# Patient Record
Sex: Male | Born: 1975 | Race: White | Hispanic: No | Marital: Married | Smoking: Current every day smoker
Health system: Southern US, Community
[De-identification: ages and names within clinical notes are randomized; demographics above are authoritative.]

## PROBLEM LIST (undated history)

## (undated) DIAGNOSIS — I10 Essential (primary) hypertension: Secondary | ICD-10-CM

---

## 2003-10-05 ENCOUNTER — Emergency Department (HOSPITAL_COMMUNITY): Admission: AD | Admit: 2003-10-05 | Discharge: 2003-10-05 | Payer: Self-pay | Admitting: Family Medicine

## 2005-08-30 ENCOUNTER — Emergency Department: Payer: Self-pay | Admitting: Emergency Medicine

## 2006-02-23 ENCOUNTER — Emergency Department: Payer: Self-pay | Admitting: Emergency Medicine

## 2006-09-26 ENCOUNTER — Emergency Department: Payer: Self-pay | Admitting: Emergency Medicine

## 2006-09-27 ENCOUNTER — Ambulatory Visit: Payer: Self-pay | Admitting: Emergency Medicine

## 2007-09-10 ENCOUNTER — Emergency Department: Payer: Self-pay | Admitting: Emergency Medicine

## 2009-04-01 ENCOUNTER — Emergency Department: Payer: Self-pay | Admitting: Emergency Medicine

## 2010-03-19 ENCOUNTER — Emergency Department: Payer: Self-pay | Admitting: Emergency Medicine

## 2011-11-16 ENCOUNTER — Ambulatory Visit: Payer: Self-pay

## 2015-09-23 ENCOUNTER — Emergency Department
Admission: EM | Admit: 2015-09-23 | Discharge: 2015-09-23 | Disposition: A | Discharge status: Home / Self Care | Payer: Self-pay | Attending: Emergency Medicine | Admitting: Emergency Medicine

## 2015-09-23 ENCOUNTER — Encounter: Payer: Self-pay | Admitting: Emergency Medicine

## 2015-09-23 ENCOUNTER — Emergency Department: Payer: Self-pay

## 2015-09-23 DIAGNOSIS — S9032XA Contusion of left foot, initial encounter: Secondary | ICD-10-CM | POA: Insufficient documentation

## 2015-09-23 DIAGNOSIS — T07XXXA Unspecified multiple injuries, initial encounter: Secondary | ICD-10-CM

## 2015-09-23 DIAGNOSIS — Y9289 Other specified places as the place of occurrence of the external cause: Secondary | ICD-10-CM | POA: Insufficient documentation

## 2015-09-23 DIAGNOSIS — W208XXA Other cause of strike by thrown, projected or falling object, initial encounter: Secondary | ICD-10-CM | POA: Insufficient documentation

## 2015-09-23 DIAGNOSIS — Y990 Civilian activity done for income or pay: Secondary | ICD-10-CM | POA: Insufficient documentation

## 2015-09-23 DIAGNOSIS — Y9301 Activity, walking, marching and hiking: Secondary | ICD-10-CM | POA: Insufficient documentation

## 2015-09-23 DIAGNOSIS — S80812A Abrasion, left lower leg, initial encounter: Secondary | ICD-10-CM | POA: Insufficient documentation

## 2015-09-23 NOTE — ED Notes (Signed)
Patient ambulatory to triage with steady gait, without difficulty or distress noted; pt reports left ankle pain after desk fell on it at approx 4pm

## 2015-09-23 NOTE — ED Notes (Signed)
Pt presents with left ankle pain. Pt states he dropped a desk on his foot while at work today. Abrasions noted to left anterior calf. Swelling noted to top of left foot. Pedal pulses palpable. Pt rates pain as 8/10.

## 2015-09-23 NOTE — ED Provider Notes (Signed)
[facility] Emergency Department Provider Note ____________________________________________  Time seen: [id number]  I have reviewed the triage vital signs and the nursing notes.  HISTORY  Chief Complaint  Ankle Pain  HPI Gregory Beck is a 39 y.o. male reports to the ED for evaluation of injury sustained to his left foot today while working. He describes carrying a heavy desk with a coworker while walking backwards up in inclined yard. He reports that were ruts in the yard, and he stepped one causing him to fall. As he fell the desk landed on his left leg and foot. He was able to finish his job, and once he went home, he debated applying ice to the foot for comfort. He reports here for evaluation of pain to the lateral aspect of his left foot which is aggravated by walking and pushing off. He denies any head injury, loss of consciousness, or other injuries.He rates the pain 8/10 in triage and denies taking any medication at home for pain relief.  History reviewed. No pertinent past medical history.  There are no active problems to display for this patient.  History reviewed. No pertinent past surgical history.  No current outpatient prescriptions on file.  Allergies Review of patient's allergies indicates no known allergies.  No family history on file.  Social History Social History  Substance Use Topics  . Smoking status: Never Smoker   . Smokeless tobacco: None  . Alcohol Use: No   Review of Systems  Constitutional: Negative for fever. Eyes: Negative for visual changes. ENT: Negative for sore throat. Cardiovascular: Negative for chest pain. Respiratory: Negative for shortness of breath. Gastrointestinal: Negative for abdominal pain, vomiting and diarrhea. Genitourinary: Negative for dysuria. Musculoskeletal: Negative for back pain. Left foot pain as above. Skin: Negative for rash. Neurological: Negative for headaches, focal weakness or  numbness. ____________________________________________  PHYSICAL EXAM:  VITAL SIGNS: ED Triage Vitals  Enc Vitals Group     BP 09/23/15 1924 165/100 mmHg     Pulse Rate 09/23/15 1924 90     Resp 09/23/15 1924 20     Temp 09/23/15 1924 98 F (36.7 C)     Temp Source 09/23/15 1924 Oral     SpO2 09/23/15 1924 97 %     Weight 09/23/15 1924 220 lb (99.791 kg)     Height 09/23/15 1924 5\' 6"  (1.676 m)     Head Cir --      Peak Flow --      Pain Score 09/23/15 1923 8     Pain Loc --      Pain Edu? --      Excl. in GC? --    Constitutional: Alert and oriented. Well appearing and in no distress. Head: Normocephalic and atraumatic.      Eyes: Conjunctivae are normal. PERRL. Normal extraocular movements      Ears: Canals clear. TMs intact bilaterally.   Nose: No congestion/rhinorrhea.   Mouth/Throat: Mucous membranes are moist.   Neck: Supple. No thyromegaly. Hematological/Lymphatic/Immunological: No cervical lymphadenopathy. Cardiovascular: Normal rate, regular rhythm. Normal distal pulses. Respiratory: Normal respiratory effort. No wheezes/rales/rhonchi. Gastrointestinal: Soft and nontender. No distention. Musculoskeletal: Mildly antalgic gait favoring the left foot. Patient's left lower extremity shows 2-3 distinct superficial abrasions to the anterior lateral shin. The left foot is without obvious deformity, abrasion, ecchymosis, or swelling. Patient is tender to palpation over the lateral aspect of the foot. Ankle exam is normal without laxity. No calf or Achilles tenderness is appreciated. Nontender with normal  range of motion in all extremities.  Neurologic:  Normal gait without ataxia. Normal speech and language. No gross focal neurologic deficits are appreciated. Skin:  Skin is warm, dry and intact. No rash noted. Psychiatric: Mood and affect are normal. Patient exhibits appropriate insight and judgment. ____________________________________________   RADIOLOGY  Left  Foot IMPRESSION: No acute osseous findings.  I, Brileigh Sevcik, Charlesetta Ivory, personally viewed and evaluated these images (plain radiographs) as part of my medical decision making.  ____________________________________________  PROCEDURES  Ace wrap  ____________________________________________  INITIAL IMPRESSION / ASSESSMENT AND PLAN / ED COURSE  Patient with a left foot contusion and left leg abrasions. He is given instruction and management of superficial abrasions and contusion management using ice and anti-inflammatories. He is to rest with the foot elevated as needed and follow up with one of the local community clinics for ongoing symptoms. ____________________________________________  FINAL CLINICAL IMPRESSION(S) / ED DIAGNOSES  Final diagnoses:  Foot contusion, left, initial encounter  Multiple abrasions      Lissa Hoard, PA-C 09/23/15 2054  Loleta Rose, MD 09/23/15 2317

## 2015-09-23 NOTE — Discharge Instructions (Signed)
Foot Contusion A foot contusion is a deep bruise to the foot. Contusions are the result of an injury that caused bleeding under the skin. The contusion may turn blue, purple, or yellow. Minor injuries will give you a painless contusion, but more severe contusions may stay painful and swollen for a few weeks. CAUSES  A foot contusion comes from a direct blow to that area, such as a heavy object falling on the foot. SYMPTOMS   Swelling of the foot.  Discoloration of the foot.  Tenderness or soreness of the foot. DIAGNOSIS  You will have a physical exam and will be asked about your history. You may need an X-ray of your foot to look for a broken bone (fracture).  TREATMENT  An elastic wrap may be recommended to support your foot. Resting, elevating, and applying cold compresses to your foot are often the best treatments for a foot contusion. Over-the-counter medicines may also be recommended for pain control. HOME CARE INSTRUCTIONS   Put ice on the injured area.  Put ice in a plastic bag.  Place a towel between your skin and the bag.  Leave the ice on for 15-20 minutes, 03-04 times a day.  Only take over-the-counter or prescription medicines for pain, discomfort, or fever as directed by your caregiver.  If told, use an elastic wrap as directed. This can help reduce swelling. You may remove the wrap for sleeping, showering, and bathing. If your toes become numb, cold, or blue, take the wrap off and reapply it more loosely.  Elevate your foot with pillows to reduce swelling.  Try to avoid standing or walking while the foot is painful. Do not resume use until instructed by your caregiver. Then, begin use gradually. If pain develops, decrease use. Gradually increase activities that do not cause discomfort until you have normal use of your foot.  See your caregiver as directed. It is very important to keep all follow-up appointments in order to avoid any lasting problems with your foot,  including long-term (chronic) pain. SEEK IMMEDIATE MEDICAL CARE IF:   You have increased redness, swelling, or pain in your foot.  Your swelling or pain is not relieved with medicines.  You have loss of feeling in your foot or are unable to move your toes.  Your foot turns cold or blue.  You have pain when you move your toes.  Your foot becomes warm to the touch.  Your contusion does not improve in 2 days. MAKE SURE YOU:   Understand these instructions.  Will watch your condition.  Will get help right away if you are not doing well or get worse.   This information is not intended to replace advice given to you by your health care provider. Make sure you discuss any questions you have with your health care provider.   Document Released: 08/08/2006 Document Revised: 04/17/2012 Document Reviewed: 06/23/2015 Elsevier Interactive Patient Education 2016 [employer]  Keep the foot wrapped with the ace wrap for support as needed.  Take Ibuprofen or Aleve for pain relief. Follow-up with one of the local community clinics as needed for continued symptoms.

## 2015-11-18 ENCOUNTER — Encounter: Payer: Self-pay | Admitting: Emergency Medicine

## 2015-11-18 DIAGNOSIS — Z23 Encounter for immunization: Secondary | ICD-10-CM | POA: Insufficient documentation

## 2015-11-18 DIAGNOSIS — W228XXA Striking against or struck by other objects, initial encounter: Secondary | ICD-10-CM | POA: Insufficient documentation

## 2015-11-18 DIAGNOSIS — Y998 Other external cause status: Secondary | ICD-10-CM | POA: Insufficient documentation

## 2015-11-18 DIAGNOSIS — Y9289 Other specified places as the place of occurrence of the external cause: Secondary | ICD-10-CM | POA: Insufficient documentation

## 2015-11-18 DIAGNOSIS — S61011A Laceration without foreign body of right thumb without damage to nail, initial encounter: Secondary | ICD-10-CM | POA: Insufficient documentation

## 2015-11-18 DIAGNOSIS — Y9389 Activity, other specified: Secondary | ICD-10-CM | POA: Insufficient documentation

## 2015-11-18 NOTE — ED Notes (Signed)
Pt presents to ED with laceration to his right thumb. States he stuck his hand in a basket and is unsure what he cut his finger on. "U" shaped laceration present. Bleeding controlled. Unsure of last tetanus shot.

## 2015-11-19 ENCOUNTER — Emergency Department
Admission: EM | Admit: 2015-11-19 | Discharge: 2015-11-19 | Disposition: A | Discharge status: Home / Self Care | Payer: Self-pay | Attending: Emergency Medicine | Admitting: Emergency Medicine

## 2015-11-19 DIAGNOSIS — S61011A Laceration without foreign body of right thumb without damage to nail, initial encounter: Secondary | ICD-10-CM

## 2015-11-19 MED ORDER — CEPHALEXIN 500 MG PO CAPS
500.0000 mg | ORAL_CAPSULE | Freq: Once | ORAL | Status: AC
  Administered 2015-11-19: 500 mg via ORAL
  Filled 2015-11-19: qty 1

## 2015-11-19 MED ORDER — CEPHALEXIN 500 MG PO CAPS: 500.0000 mg | ORAL_CAPSULE | Freq: Two times a day (BID) | ORAL | Status: AC

## 2015-11-19 MED ORDER — LIDOCAINE HCL (PF) 1 % IJ SOLN
INTRAMUSCULAR | Status: AC
  Administered 2015-11-19: 5 mL via INTRADERMAL
  Filled 2015-11-19: qty 5

## 2015-11-19 MED ORDER — TETANUS-DIPHTH-ACELL PERTUSSIS 5-2.5-18.5 LF-MCG/0.5 IM SUSP
0.5000 mL | Freq: Once | INTRAMUSCULAR | Status: AC
  Administered 2015-11-19: 0.5 mL via INTRAMUSCULAR

## 2015-11-19 MED ORDER — OXYCODONE-ACETAMINOPHEN 5-325 MG PO TABS
1.0000 | ORAL_TABLET | Freq: Once | ORAL | Status: AC
  Administered 2015-11-19: 1 via ORAL
  Filled 2015-11-19: qty 1

## 2015-11-19 MED ORDER — TETANUS-DIPHTH-ACELL PERTUSSIS 5-2.5-18.5 LF-MCG/0.5 IM SUSP
INTRAMUSCULAR | Status: AC
  Administered 2015-11-19: 0.5 mL via INTRAMUSCULAR
  Filled 2015-11-19: qty 0.5

## 2015-11-19 MED ORDER — OXYCODONE-ACETAMINOPHEN 5-325 MG PO TABS: 1.0000 | ORAL_TABLET | ORAL | Status: AC | PRN

## 2015-11-19 MED ORDER — LIDOCAINE HCL (PF) 1 % IJ SOLN
5.0000 mL | Freq: Once | INTRAMUSCULAR | Status: AC
  Administered 2015-11-19: 5 mL via INTRADERMAL

## 2015-11-19 NOTE — ED Notes (Signed)
Pt stuck his hand in a basket and was cut by unknown object. Pt has a small laceration on his 1 st finger right hand. Pt denies any other complaints. Bleeding controlled.

## 2015-11-19 NOTE — Discharge Instructions (Signed)
Laceration Care, Adult PLEASE HAVE SUTURES REMOVED IN 7 DAYS A laceration is a cut that goes through all of the layers of the skin and into the tissue that is right under the skin. Some lacerations heal on their own. Others need to be closed with stitches (sutures), staples, skin adhesive strips, or skin glue. Proper laceration care minimizes the risk of infection and helps the laceration to heal better. HOW TO CARE FOR YOUR LACERATION If sutures or staples were used:  Keep the wound clean and dry.  If you were given a bandage (dressing), you should change it at least one time per day or as told by your health care provider. You should also change it if it becomes wet or dirty.  Keep the wound completely dry for the first 24 hours or as told by your health care provider. After that time, you may shower or bathe. However, make sure that the wound is not soaked in water until after the sutures or staples have been removed.  Clean the wound one time each day or as told by your health care provider:  Wash the wound with soap and water.  Rinse the wound with water to remove all soap.  Pat the wound dry with a clean towel. Do not rub the wound.  After cleaning the wound, apply a thin layer of antibiotic ointmentas told by your health care provider. This will help to prevent infection and keep the dressing from sticking to the wound.  Have the sutures or staples removed as told by your health care provider. If skin adhesive strips were used:  Keep the wound clean and dry.  If you were given a bandage (dressing), you should change it at least one time per day or as told by your health care provider. You should also change it if it becomes dirty or wet.  Do not get the skin adhesive strips wet. You may shower or bathe, but be careful to keep the wound dry.  If the wound gets wet, pat it dry with a clean towel. Do not rub the wound.  Skin adhesive strips fall off on their own. You may trim  the strips as the wound heals. Do not remove skin adhesive strips that are still stuck to the wound. They will fall off in time. If skin glue was used:  Try to keep the wound dry, but you may briefly wet it in the shower or bath. Do not soak the wound in water, such as by swimming.  After you have showered or bathed, gently pat the wound dry with a clean towel. Do not rub the wound.  Do not do any activities that will make you sweat heavily until the skin glue has fallen off on its own.  Do not apply liquid, cream, or ointment medicine to the wound while the skin glue is in place. Using those may loosen the film before the wound has healed.  If you were given a bandage (dressing), you should change it at least one time per day or as told by your health care provider. You should also change it if it becomes dirty or wet.  If a dressing is placed over the wound, be careful not to apply tape directly over the skin glue. Doing that may cause the glue to be pulled off before the wound has healed.  Do not pick at the glue. The skin glue usually remains in place for 5-10 days, then it falls off of the skin.  General Instructions  Take over-the-counter and prescription medicines only as told by your health care provider.  If you were prescribed an antibiotic medicine or ointment, take or apply it as told by your doctor. Do not stop using it even if your condition improves.  To help prevent scarring, make sure to cover your wound with sunscreen whenever you are outside after stitches are removed, after adhesive strips are removed, or when glue remains in place and the wound is healed. Make sure to wear a sunscreen of at least 30 SPF.  Do not scratch or pick at the wound.  Keep all follow-up visits as told by your health care provider. This is important.  Check your wound every day for signs of infection. Watch for:  Redness, swelling, or pain.  Fluid, blood, or pus.  Raise (elevate) the  injured area above the level of your heart while you are sitting or lying down, if possible. SEEK MEDICAL CARE IF:  You received a tetanus shot and you have swelling, severe pain, redness, or bleeding at the injection site.  You have a fever.  A wound that was closed breaks open.  You notice a bad smell coming from your wound or your dressing.  You notice something coming out of the wound, such as wood or glass.  Your pain is not controlled with medicine.  You have increased redness, swelling, or pain at the site of your wound.  You have fluid, blood, or pus coming from your wound.  You notice a change in the color of your skin near your wound.  You need to change the dressing frequently due to fluid, blood, or pus draining from the wound.  You develop a new rash.  You develop numbness around the wound. SEEK IMMEDIATE MEDICAL CARE IF:  You develop severe swelling around the wound.  Your pain suddenly increases and is severe.  You develop painful lumps near the wound or on skin that is anywhere on your body.  You have a red streak going away from your wound.  The wound is on your hand or foot and you cannot properly move a finger or toe.  The wound is on your hand or foot and you notice that your fingers or toes look pale or bluish.   This information is not intended to replace advice given to you by your health care provider. Make sure you discuss any questions you have with your health care provider.   Document Released: 10/17/2005 Document Revised: 03/03/2015 Document Reviewed: 10/13/2014 Elsevier Interactive Patient Education [employer]2016 Elsevier Inc.

## 2015-11-19 NOTE — ED Notes (Signed)

## 2015-11-19 NOTE — ED Provider Notes (Signed)
[facility] Emergency Department Provider Note  ____________________________________________  Time seen: 2:40am  I have reviewed the triage vital signs and the nursing notes.   HISTORY  Chief Complaint Extremity Laceration     HPI Gregory Beck is a 40 y.o. male tensor history of accidental right thumb laceration while sick in his hand into a basket. Patient unsure what cut his thumb. Patient states his last tetanus shot was possibly within the last 10 years however he is unsure.   Past medical history No pertinent past medical history  Past surgical history Pertinent past surgical history   Current Outpatient Rx  Name  Route  Sig  Dispense  Refill  . cephALEXin (KEFLEX) 500 MG capsule   Oral   Take 1 capsule (500 mg total) by mouth 2 (two) times daily.   14 capsule   0   . oxyCODONE-acetaminophen (PERCOCET/ROXICET) 5-325 MG tablet   Oral   Take 1 tablet by mouth every 4 (four) hours as needed for severe pain.   8 tablet   0     Allergies Review of patient's allergies indicates no known allergies.  No family history on file.  Social History Social History  Substance Use Topics  . Smoking status: Never Smoker   . Smokeless tobacco: Never Used  . Alcohol Use: Yes    Review of Systems  Constitutional: Negative for fever. Eyes: Negative for visual changes. ENT: Negative for sore throat. Cardiovascular: Negative for chest pain. Respiratory: Negative for shortness of breath. Gastrointestinal: Negative for abdominal pain, vomiting and diarrhea. Genitourinary: Negative for dysuria. Musculoskeletal: Negative for back pain. Skin: Negative for rash. Positive for right thumb laceration Neurological: Negative for headaches, focal weakness or numbness.   10-point ROS otherwise negative.  ____________________________________________   PHYSICAL EXAM:  VITAL SIGNS: ED Triage Vitals  Enc Vitals Group     BP 11/18/15 2333 140/88  mmHg     Pulse Rate 11/18/15 2333 88     Resp 11/18/15 2333 18     Temp 11/18/15 2333 97.5 F (36.4 C)     Temp Source 11/18/15 2333 Oral     SpO2 11/18/15 2333 97 %     Weight 11/18/15 2333 210 lb (95.255 kg)     Height 11/18/15 2333  (1.676 m)     Head Cir --      Peak Flow --      Pain Score 11/18/15 2333 6     Pain Loc --      Pain Edu? --      Excl. in GC? --      Constitutional: Alert and oriented. Well appearing and in no distress. Eyes: Conjunctivae are normal. PERRL. Normal extraocular movements. ENT   Head: Normocephalic and atraumatic.   Nose: No congestion/rhinnorhea.   Mouth/Throat: Mucous membranes are moist.   Neck: No stridor. Hematological/Lymphatic/Immunilogical: No cervical lymphadenopathy. Cardiovascular: Normal rate, regular rhythm. Normal and symmetric distal pulses are present in all extremities. No murmurs, rubs, or gallops. Respiratory: Normal respiratory effort without tachypnea nor retractions. Breath sounds are clear and equal bilaterally. No wheezes/rales/rhonchi. Gastrointestinal: Soft and nontender. No distention. There is no CVA tenderness. Genitourinary: deferred Musculoskeletal: Nontender with normal range of motion in all extremities. No joint effusions.  No lower extremity tenderness nor edema. Neurologic:  Normal speech and language. No gross focal neurologic deficits are appreciated. Speech is normal.  Skin:  Skin is warm, dry and intact. No rash noted. Positive for right thumb laceration Psychiatric: Mood  and affect are normal. Speech and behavior are normal. Patient exhibits appropriate insight and judgment.     PROCEDURES  Procedure(s) performed: LACERATION REPAIR Performed by: Darci Current Authorized by: Darci Current Consent: Verbal consent obtained. Risks and benefits: risks, benefits and alternatives were discussed Consent given by: patient Patient identity confirmed: provided demographic  data Prepped and Draped in normal sterile fashion Wound explored  Laceration Location: RIGHT THUMB   Laceration Length: 1 inch  No Foreign Bodies seen or palpated  Anesthesia: local infiltration  Local anesthetic: lidocaine 1%   Anesthetic total: 5 ml  Irrigation method: syringe Amount of cleaning: standard  Skin closure: 5-0 nylon   Number of sutures: 8   Technique: and plan interrupted   Patient tolerance: Patient tolerated the procedure well with no immediate complications.    INITIAL IMPRESSION / ASSESSMENT AND PLAN / ED COURSE  Pertinent labs & imaging results that were available during my care of the patient were reviewed by me and considered in my medical decision making (see chart for details).    ____________________________________________   FINAL CLINICAL IMPRESSION(S) / ED DIAGNOSES  Final diagnoses:  Thumb laceration, right, initial encounter      Darci Current, MD 11/19/15 [phone]

## 2016-05-14 ENCOUNTER — Emergency Department
Admission: EM | Admit: 2016-05-14 | Discharge: 2016-05-15 | Disposition: A | Discharge status: Home / Self Care | Payer: Self-pay | Attending: Emergency Medicine | Admitting: Emergency Medicine

## 2016-05-14 DIAGNOSIS — Y829 Unspecified medical devices associated with adverse incidents: Secondary | ICD-10-CM | POA: Insufficient documentation

## 2016-05-14 DIAGNOSIS — T783XXA Angioneurotic edema, initial encounter: Secondary | ICD-10-CM | POA: Insufficient documentation

## 2016-05-14 DIAGNOSIS — T464X5A Adverse effect of angiotensin-converting-enzyme inhibitors, initial encounter: Secondary | ICD-10-CM | POA: Insufficient documentation

## 2016-05-14 DIAGNOSIS — T887XXA Unspecified adverse effect of drug or medicament, initial encounter: Secondary | ICD-10-CM | POA: Insufficient documentation

## 2016-05-14 DIAGNOSIS — T7840XA Allergy, unspecified, initial encounter: Secondary | ICD-10-CM

## 2016-05-14 HISTORY — DX: Essential (primary) hypertension: I10

## 2016-05-14 MED ORDER — DIPHENHYDRAMINE HCL 50 MG/ML IJ SOLN
25.0000 mg | Freq: Once | INTRAMUSCULAR | Status: DC
  Filled 2016-05-14: qty 1

## 2016-05-14 MED ORDER — SODIUM CHLORIDE 0.9 % IV BOLUS (SEPSIS)
1000.0000 mL | Freq: Once | INTRAVENOUS | Status: AC
  Administered 2016-05-14: 1000 mL via INTRAVENOUS

## 2016-05-14 MED ORDER — EPINEPHRINE 0.3 MG/0.3ML IJ SOAJ
0.3000 mg | Freq: Once | INTRAMUSCULAR | Status: AC
  Administered 2016-05-14: 0.3 mg via INTRAMUSCULAR

## 2016-05-14 MED ORDER — FAMOTIDINE IN NACL 20-0.9 MG/50ML-% IV SOLN
20.0000 mg | Freq: Once | INTRAVENOUS | Status: DC
  Filled 2016-05-14: qty 50

## 2016-05-14 MED ORDER — DIPHENHYDRAMINE HCL 50 MG/ML IJ SOLN
25.0000 mg | Freq: Once | INTRAMUSCULAR | Status: AC
  Administered 2016-05-14: 25 mg via INTRAVENOUS

## 2016-05-14 MED ORDER — METHYLPREDNISOLONE SODIUM SUCC 125 MG IJ SOLR
125.0000 mg | Freq: Once | INTRAMUSCULAR | Status: DC
  Filled 2016-05-14: qty 2

## 2016-05-14 MED ORDER — METHYLPREDNISOLONE SODIUM SUCC 125 MG IJ SOLR
125.0000 mg | Freq: Once | INTRAMUSCULAR | Status: AC
  Administered 2016-05-14: 125 mg via INTRAVENOUS

## 2016-05-14 MED ORDER — FAMOTIDINE IN NACL 20-0.9 MG/50ML-% IV SOLN
20.0000 mg | Freq: Once | INTRAVENOUS | Status: AC
  Administered 2016-05-14: 20 mg via INTRAVENOUS

## 2016-05-14 MED ORDER — EPINEPHRINE 0.3 MG/0.3ML IJ SOAJ
0.3000 mg | Freq: Once | INTRAMUSCULAR | Status: DC
  Filled 2016-05-14: qty 0.3

## 2016-05-14 NOTE — ED Notes (Signed)
Pt. Presents to the ED with swollen bottom lip.  Pt. States starting on lisinopril 2 weeks ago.  Pt. States he noticed bottom lip swelling at around 4 pm today.

## 2016-05-14 NOTE — ED Provider Notes (Signed)
[facility] Emergency Department Provider Note   ____________________________________________  Time seen: Approximately 11:31 PM  I have reviewed the triage vital signs and the nursing notes.   HISTORY  Chief Complaint Allergic Reaction    HPI Gregory Beck is a 40 y.o. male who presents to the ED from home with a chief complaint of swollen lip. Reports being started on lisinopril 2 weeks ago. States he started feeling tingly in his bottom lip and mild swelling approximately 4 PM. By nighttime lower lip swelling had progressed to moderate swelling associated with feet itching and hives to trunk. Denies new foods, exposures, medicines. States he was seen for allergic reaction ("red spots") to his knee 2 weeks ago at an outside hospital; patient was placed on Keflex and started lisinopril for hypertension.He was not placed on steroids or Benadryl. Denies associated fever, chills, chest pain, shortness of breath, abdominal pain, nausea, vomiting, diarrhea. Denies recent travel or trauma. Nothing makes his symptoms better or worse.   Past Medical History  Diagnosis Date  . Hypertension     There are no active problems to display for this patient.   History reviewed. No pertinent past surgical history.  Current Outpatient Rx  Name  Route  Sig  Dispense  Refill  . oxyCODONE-acetaminophen (PERCOCET/ROXICET) 5-325 MG tablet   Oral   Take 1 tablet by mouth every 4 (four) hours as needed for severe pain.   8 tablet   0     Allergies Lisinopril  No family history on file.  Social History Social History  Substance Use Topics  . Smoking status: Never Smoker   . Smokeless tobacco: Never Used  . Alcohol Use: Yes    Review of Systems  Constitutional: No fever/chills. Eyes: No visual changes. ENT: Positive for lower lip swelling. No sore throat. Cardiovascular: Denies chest pain. Respiratory: Denies shortness of breath. Gastrointestinal: No  abdominal pain.  No nausea, no vomiting.  No diarrhea.  No constipation. Genitourinary: Negative for dysuria. Musculoskeletal: Negative for back pain. Skin: Positive for rash. Neurological: Negative for headaches, focal weakness or numbness.  10-point ROS otherwise negative.  ____________________________________________   PHYSICAL EXAM:  VITAL SIGNS: ED Triage Vitals  Enc Vitals Group     BP 05/14/16 2318 157/108 mmHg     Pulse Rate 05/14/16 2318 90     Resp 05/14/16 2318 20     Temp 05/14/16 2322 98.2 F (36.8 C)     Temp src --      SpO2 05/14/16 2318 99 %     Weight 05/14/16 2318 220 lb (99.791 kg)     Height 05/14/16 2318  (1.676 m)     Head Cir --      Peak Flow --      Pain Score 05/14/16 2319 0     Pain Loc --      Pain Edu? --      Excl. in GC? --     Constitutional: Alert and oriented. Well appearing and in no acute distress. Eyes: Conjunctivae are normal. PERRL. EOMI. Head: Atraumatic. Nose: No congestion/rhinnorhea. Mouth/Throat: Lower lip with mild to moderate swelling. There is no other facial swelling or tongue swelling. There is no hoarse or muffled voice. There is no drooling. Patient is tolerating secretions well. Mucous membranes are moist.  Oropharynx non-erythematous.   Neck: No stridor.  Soft submental space. Cardiovascular: Normal rate, regular rhythm. Grossly normal heart sounds.  Good peripheral circulation. Respiratory: Normal respiratory effort.  No retractions. Lungs CTAB. No wheezing. Gastrointestinal: Soft and nontender. No distention. No abdominal bruits. No CVA tenderness. Musculoskeletal: No lower extremity tenderness nor edema.  No joint effusions. Neurologic:  Normal speech and language. No gross focal neurologic deficits are appreciated. No gait instability. Skin:  Skin is warm, dry and intact. Mild urticaria noted to trunk. No petechiae. Psychiatric: Mood and affect are normal. Speech and behavior are  normal.  ____________________________________________   LABS (all labs ordered are listed, but only abnormal results are displayed)  Labs Reviewed - No data to display ____________________________________________  EKG  None ____________________________________________  RADIOLOGY  None ____________________________________________   PROCEDURES  Procedure(s) performed: None  Procedures  Critical Care performed: No  ____________________________________________   INITIAL IMPRESSION / ASSESSMENT AND PLAN / ED COURSE  Pertinent labs & imaging results that were available during my care of the patient were reviewed by me and considered in my medical decision making (see chart for details).  40 year old male who presents with lower lip swelling, itchy feet and truncal hives. Likely reaction secondary to lisinopril. Will initiate IV fluid resuscitation, intramuscular epinephrine, IV Solu-Medrol, Pepcid and Benadryl. Will monitor for a minimum of 3 hours.  ----------------------------------------- 2:45 AM on 05/15/2016 -----------------------------------------  Patient is feeling much better. There is no further swelling of his lower lip. There is no tongue swelling. Hives have resolved. Room air saturations 99%. Patient speaks in a clear voice. Discussed with patient that I am unsure if he requires antihypertensive. Encouraged patient to keep a blood pressure log. Will refer to allergist for testing. Will continue prednisone and Pepcid for the next 4 days as well as a prescription for EpiPen. Strict return precautions given. Patient and spouse verbalize understanding and agree with plan of care. ____________________________________________   FINAL CLINICAL IMPRESSION(S) / ED DIAGNOSES  Final diagnoses:  Allergic reaction caused by a drug  Angioedema, initial encounter      NEW MEDICATIONS STARTED DURING THIS VISIT:  New Prescriptions   No medications on file      Note:  This document was prepared using Dragon voice recognition software and may include unintentional dictation errors.    Irean HongJade J Atziri Zubiate, MD 05/15/16 [id number]

## 2016-05-14 NOTE — ED Notes (Signed)
Patient to ED with lip swelling and feet itching. Started taking Lisinopril two weeks ago. Today around 4pm started complaining to wife that his lips felt funny. All of a sudden tonight his lips were very swollen and he has hives and itchy feet. No tongue involvement at this time.

## 2016-05-15 MED ORDER — FAMOTIDINE 20 MG PO TABS: 20.0000 mg | ORAL_TABLET | Freq: Two times a day (BID) | ORAL | Status: AC

## 2016-05-15 MED ORDER — PREDNISONE 20 MG PO TABS: ORAL_TABLET | ORAL | Status: AC

## 2016-05-15 MED ORDER — EPINEPHRINE 0.3 MG/0.3ML IJ SOAJ: 0.3000 mg | Freq: Once | INTRAMUSCULAR | Status: AC

## 2016-05-15 NOTE — ED Notes (Signed)
Pt. States gradually improvement.  Pt. States easier to speak.

## 2016-05-15 NOTE — ED Notes (Signed)
Pt. Going home with wife. 

## 2016-05-15 NOTE — Discharge Instructions (Signed)
1. Discontinue lisinopril. We have updated our computer system to include this as an allergy. 2. Take these medicines for the next 4 days: Prednisone 60 mg daily Pepcid 20 mg twice daily 3. Use EpiPen in acute, life-threatening allergic reaction. 4. You may take Benadryl as needed. 5. Return to the ER for worsening symptoms, persistent vomiting, increased swelling, difficult breathing or other concerns.  Allergies An allergy is an abnormal reaction to a substance by the body's defense system (immune system). Allergies can develop at any age. WHAT CAUSES ALLERGIES? An allergic reaction happens when the immune system mistakenly reacts to a normally harmless substance, called an allergen, as if it were harmful. The immune system releases antibodies to fight the substance. Antibodies eventually release a chemical called histamine into the bloodstream. The release of histamine is meant to protect the body from infection, but it also causes discomfort. An allergic reaction can be triggered by:  Eating an allergen.  Inhaling an allergen.  Touching an allergen. WHAT TYPES OF ALLERGIES ARE THERE? There are many types of allergies. Common types include:  Seasonal allergies. People with this type of allergy are usually allergic to substances that are only present during certain seasons, such as molds and pollens.  Food allergies.  Drug allergies.  Insect allergies.  Animal dander allergies. WHAT ARE SYMPTOMS OF ALLERGIES? Possible allergy symptoms include:  Swelling of the lips, face, tongue, mouth, or throat.  Sneezing, coughing, or wheezing.  Nasal congestion.  Tingling in the mouth.  Rash.  Itching.  Itchy, red, swollen areas of skin (hives).  Watery eyes.  Vomiting.  Diarrhea.  Dizziness.  Lightheadedness.  Fainting.  Trouble breathing or swallowing.  Chest tightness.  Rapid heartbeat. HOW ARE ALLERGIES DIAGNOSED? Allergies are diagnosed with a medical and  family history and one or more of the following:  Skin tests.  Blood tests.  A food diary. A food diary is a record of all the foods and drinks you have in a day and of all the symptoms you experience.  The results of an elimination diet. An elimination diet involves eliminating foods from your diet and then adding them back in one by one to find out if a certain food causes an allergic reaction. HOW ARE ALLERGIES TREATED? There is no cure for allergies, but allergic reactions can be treated with medicine. Severe reactions usually need to be treated at a hospital. HOW CAN REACTIONS BE PREVENTED? The best way to prevent an allergic reaction is by avoiding the substance you are allergic to. Allergy shots and medicines can also help prevent reactions in some cases. People with severe allergic reactions may be able to prevent a life-threatening reaction called anaphylaxis with a medicine given right after exposure to the allergen.   This information is not intended to replace advice given to you by your health care provider. Make sure you discuss any questions you have with your health care provider.   Document Released: 01/10/2003 Document Revised: 11/07/2014 Document Reviewed: 07/29/2014 Elsevier Interactive Patient Education 2016 Elsevier Inc.  Angioedema Angioedema is a sudden swelling of tissues, often of the skin. It can occur on the face or genitals or in the abdomen or other body parts. The swelling usually develops over a short period and gets better in 24 to 48 hours. It often begins during the night and is found when the person wakes up. The person may also get red, itchy patches of skin (hives). Angioedema can be dangerous if it involves swelling of the air  passages.  Depending on the cause, episodes of angioedema may only happen once, come back in unpredictable patterns, or repeat for several years and then gradually fade away.  CAUSES  Angioedema can be caused by an allergic  reaction to various triggers. It can also result from nonallergic causes, including reactions to drugs, immune system disorders, viral infections, or an abnormal gene that is passed to you from your parents (hereditary). For some people with angioedema, the cause is unknown.  Some things that can trigger angioedema include:   Foods.   Medicines, such as ACE inhibitors, ARBs, nonsteroidal anti-inflammatory agents, or estrogen.   Latex.   Animal saliva.   Insect stings.   Dyes used in X-rays.   Mild injury.   Dental work.  Surgery.  Stress.   Sudden changes in temperature.   Exercise. SIGNS AND SYMPTOMS   Swelling of the skin.  Hives. If these are present, there is also intense itching.  Redness in the affected area.   Pain in the affected area.  Swollen lips or tongue.  Breathing problems. This may happen if the air passages swell.  Wheezing. If internal organs are involved, there may be:   Nausea.   Abdominal pain.   Vomiting.   Difficulty swallowing.   Difficulty passing urine. DIAGNOSIS   Your health care provider will examine the affected area and take a medical and family history.  Various tests may be done to help determine the cause. Tests may include:  Allergy skin tests to see if the problem is an allergic reaction.   Blood tests to check for hereditary angioedema.   Tests to check for underlying diseases that could cause the condition.   A review of your medicines, including over-the-counter medicines, may be done. TREATMENT  Treatment will depend on the cause of the angioedema. Possible treatments include:   Removal of anything that triggered the condition (such as stopping certain medicines).   Medicines to treat symptoms or prevent attacks. Medicines given may include:   Antihistamines.   Epinephrine injection.   Steroids.   Hospitalization may be required for severe attacks. If the air passages are  affected, it can be an emergency. Tubes may need to be placed to keep the airway open. HOME CARE INSTRUCTIONS   Take all medicines as directed by your health care provider.  If you were given medicines for emergency allergy treatment, always carry them with you.  Wear a medical bracelet as directed by your health care provider.   Avoid known triggers. SEEK MEDICAL CARE IF:   You have repeat attacks of angioedema.   Your attacks are more frequent or more severe despite preventive measures.   You have hereditary angioedema and are considering having children. It is important to discuss with your health care provider the risks of passing the condition on to your children. SEEK IMMEDIATE MEDICAL CARE IF:   You have severe swelling of the mouth, tongue, or lips.  You have difficulty breathing.   You have difficulty swallowing.   You faint. MAKE SURE YOU:  Understand these instructions.  Will watch your condition.  Will get help right away if you are not doing well or get worse.   This information is not intended to replace advice given to you by your health care provider. Make sure you discuss any questions you have with your health care provider.   Document Released: 12/26/2001 Document Revised: 11/07/2014 Document Reviewed: 06/10/2013 Elsevier Interactive Patient Education 2016 [employer].  Hives Hives are  itchy, red, puffy (swollen) areas of the skin. Hives can change in size and location on your body. Hives can come and go for hours, days, or weeks. Hives do not spread from person to person (noncontagious). Scratching, exercise, and stress can make your hives worse. HOME CARE  Avoid things that cause your hives (triggers).  Take antihistamine medicines as told by your doctor. Do not drive while taking an antihistamine.  Take any other medicines for itching as told by your doctor.  Wear loose-fitting clothing.  Keep all doctor visits as told. GET HELP RIGHT  AWAY IF:   You have a fever.  Your tongue or lips are puffy.  You have trouble breathing or swallowing.  You feel tightness in the throat or chest.  You have belly (abdominal) pain.  You have lasting or severe itching that is not helped by medicine.  You have painful or puffy joints. These problems may be the first sign of a life-threatening allergic reaction. Call your local emergency services (911 in [place] MAKE SURE YOU:   Understand these instructions.  Will watch your condition.  Will get help right away if you are not doing well or get worse.   This information is not intended to replace advice given to you by your health care provider. Make sure you discuss any questions you have with your health care provider.   Document Released: 07/26/2008 Document Revised: 04/17/2012 Document Reviewed: 01/10/2012 Elsevier Interactive Patient Education [employer] Elsevier Inc.

## 2017-04-22 ENCOUNTER — Emergency Department
Admission: EM | Admit: 2017-04-22 | Discharge: 2017-04-22 | Disposition: A | Discharge status: Home / Self Care | Payer: No Typology Code available for payment source | Attending: Emergency Medicine | Admitting: Emergency Medicine

## 2017-04-22 ENCOUNTER — Emergency Department: Payer: No Typology Code available for payment source

## 2017-04-22 ENCOUNTER — Encounter: Payer: Self-pay | Admitting: Emergency Medicine

## 2017-04-22 DIAGNOSIS — S92332A Displaced fracture of third metatarsal bone, left foot, initial encounter for closed fracture: Secondary | ICD-10-CM | POA: Diagnosis not present

## 2017-04-22 DIAGNOSIS — S92352A Displaced fracture of fifth metatarsal bone, left foot, initial encounter for closed fracture: Secondary | ICD-10-CM | POA: Insufficient documentation

## 2017-04-22 DIAGNOSIS — Y929 Unspecified place or not applicable: Secondary | ICD-10-CM | POA: Insufficient documentation

## 2017-04-22 DIAGNOSIS — S92322A Displaced fracture of second metatarsal bone, left foot, initial encounter for closed fracture: Secondary | ICD-10-CM | POA: Diagnosis not present

## 2017-04-22 DIAGNOSIS — F17210 Nicotine dependence, cigarettes, uncomplicated: Secondary | ICD-10-CM | POA: Diagnosis not present

## 2017-04-22 DIAGNOSIS — S92355A Nondisplaced fracture of fifth metatarsal bone, left foot, initial encounter for closed fracture: Secondary | ICD-10-CM

## 2017-04-22 DIAGNOSIS — Y999 Unspecified external cause status: Secondary | ICD-10-CM | POA: Insufficient documentation

## 2017-04-22 DIAGNOSIS — S92342A Displaced fracture of fourth metatarsal bone, left foot, initial encounter for closed fracture: Secondary | ICD-10-CM | POA: Diagnosis not present

## 2017-04-22 DIAGNOSIS — Z79899 Other long term (current) drug therapy: Secondary | ICD-10-CM | POA: Diagnosis not present

## 2017-04-22 DIAGNOSIS — I10 Essential (primary) hypertension: Secondary | ICD-10-CM | POA: Diagnosis not present

## 2017-04-22 DIAGNOSIS — Y9389 Activity, other specified: Secondary | ICD-10-CM | POA: Insufficient documentation

## 2017-04-22 DIAGNOSIS — S99922A Unspecified injury of left foot, initial encounter: Secondary | ICD-10-CM | POA: Diagnosis present

## 2017-04-22 MED ORDER — MELOXICAM 15 MG PO TABS: 15.0000 mg | ORAL_TABLET | Freq: Every day | ORAL | 0 refills | Status: AC

## 2017-04-22 MED ORDER — HYDROCODONE-ACETAMINOPHEN 5-325 MG PO TABS: 1.0000 | ORAL_TABLET | ORAL | 0 refills | Status: AC | PRN

## 2017-04-22 MED ORDER — HYDROCODONE-ACETAMINOPHEN 5-325 MG PO TABS
1.0000 | ORAL_TABLET | Freq: Once | ORAL | Status: AC
  Administered 2017-04-22: 1 via ORAL
  Filled 2017-04-22: qty 1

## 2017-04-22 NOTE — ED Notes (Signed)
Patient is complaining of numbness to his 4th and 5th toes on his left foot.
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
## 2019-06-11 IMAGING — DX DG FOOT COMPLETE 3+V*L*
3 series · 3 of 3 positions shown · non-contrast
Comparison: None.

CLINICAL DATA: Pain after trauma.

EXAM:
LEFT FOOT - COMPLETE 3+ VIEW

[foot ap]
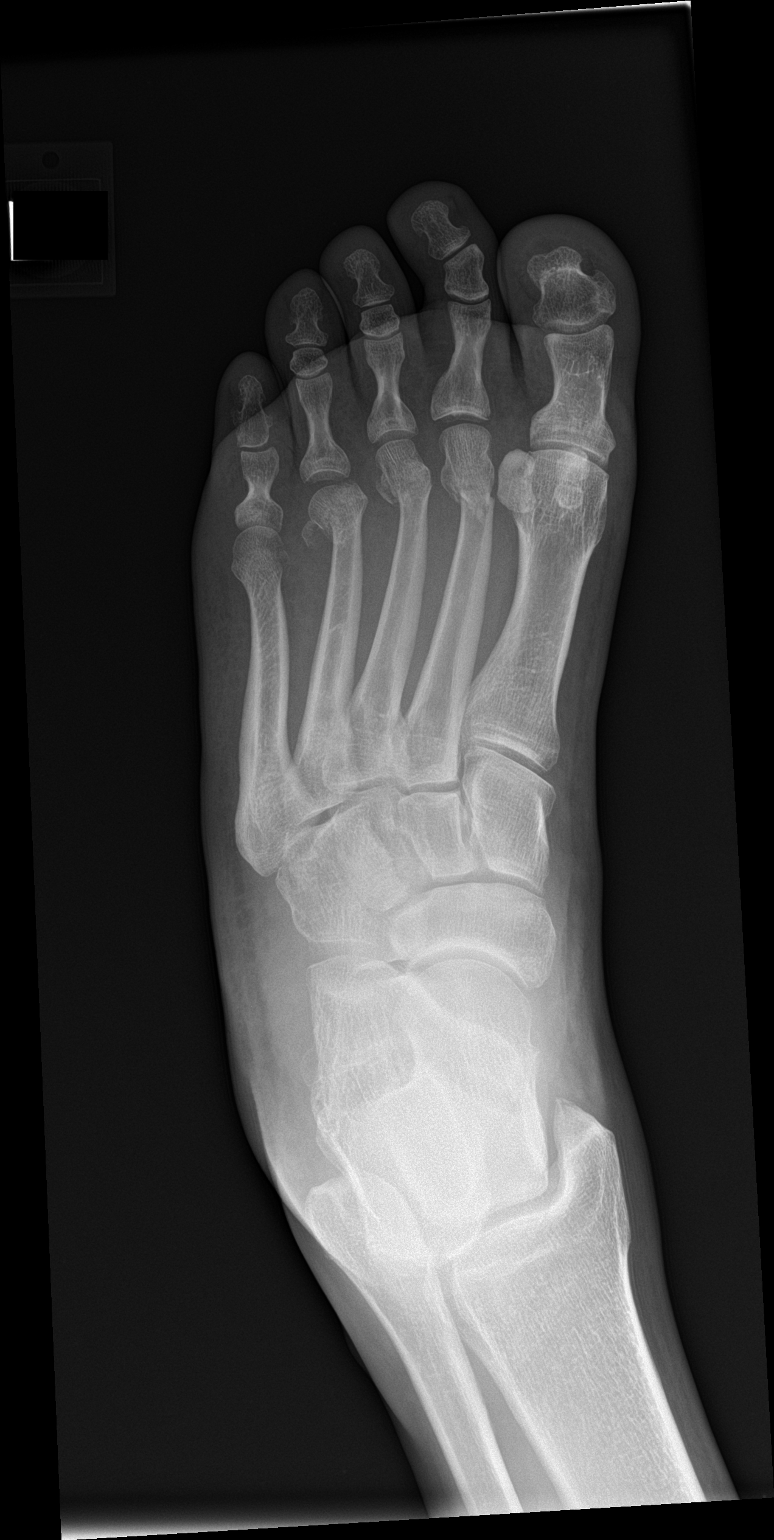

[foot obl]
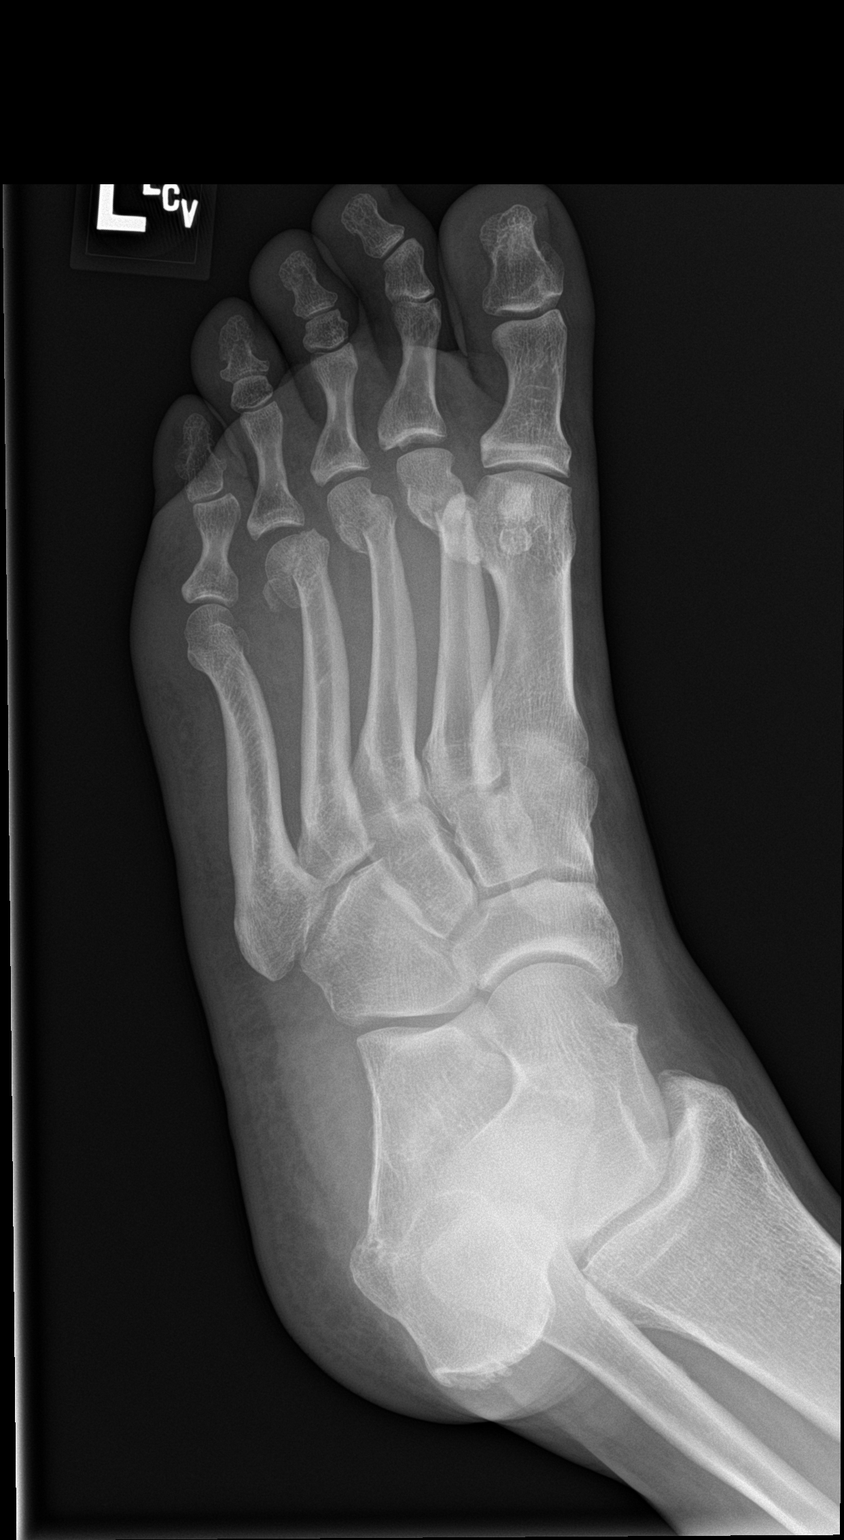

[foot lat]
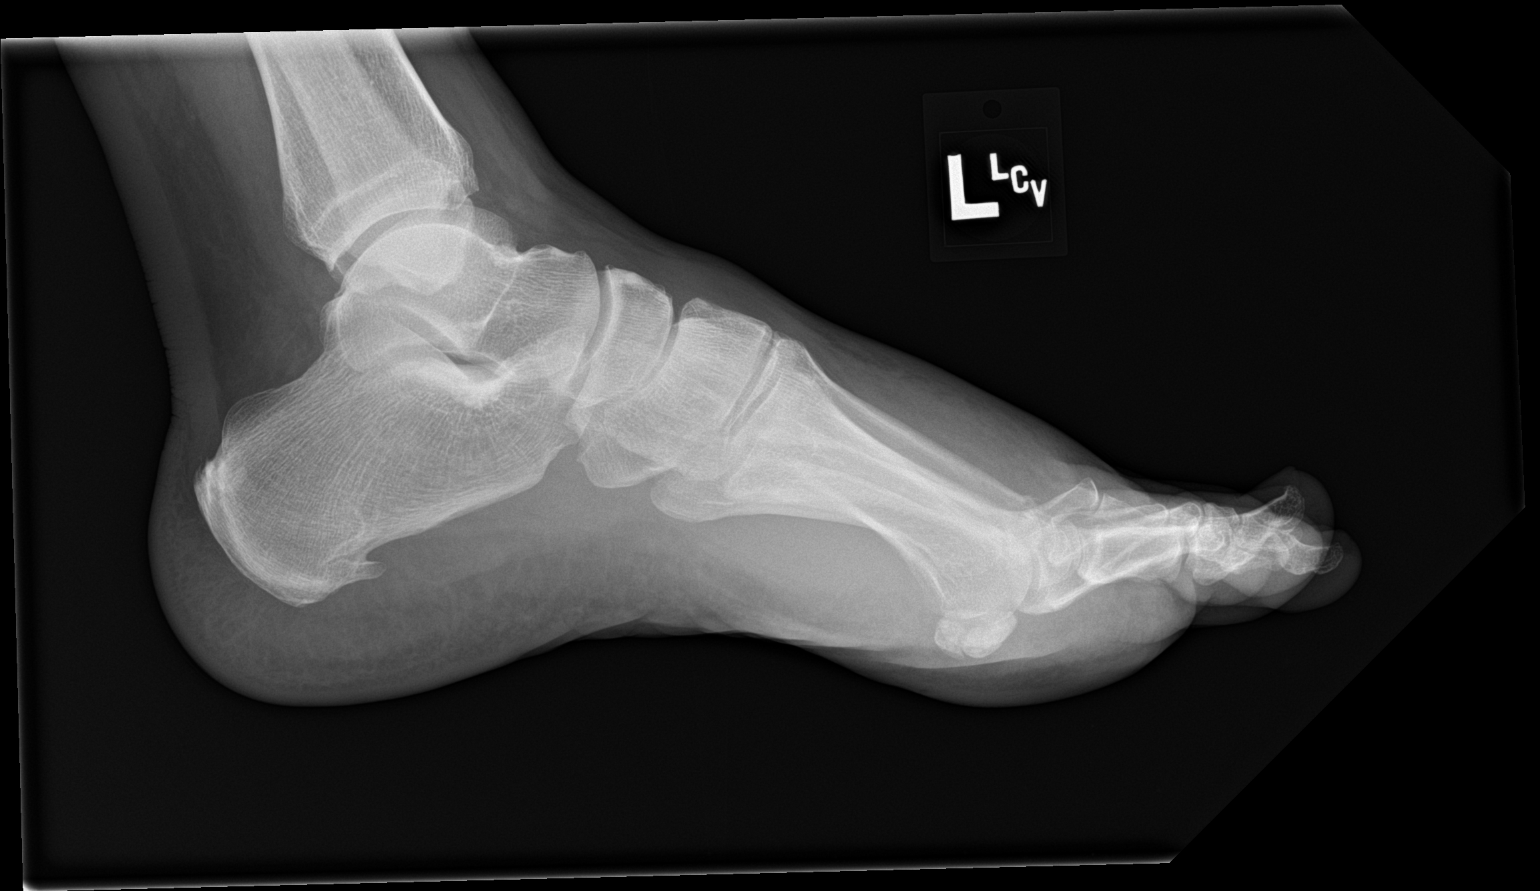

[3 of 3 positions shown; findings below may reference images not displayed]

FINDINGS: Mildly displaced fractures through the distal second, third, and
fourth metatarsals. There also appears to be a fracture through the
medial fifth metatarsal head. No other fractures are identified.
Soft tissue swelling is noted.
IMPRESSION: Mildly displaced fractures through the distal second, third, and
fourth metatarsals. There is a fracture through the medial fifth
metatarsal head as well.

## 2024-12-10 ENCOUNTER — Ambulatory Visit

## 2024-12-12 ENCOUNTER — Ambulatory Visit

## 2024-12-12 ENCOUNTER — Ambulatory Visit: Payer: Self-pay

## 2024-12-18 ENCOUNTER — Ambulatory Visit

## 2024-12-18 ENCOUNTER — Ambulatory Visit: Payer: Self-pay

## 2024-12-24 ENCOUNTER — Ambulatory Visit

## 2024-12-24 ENCOUNTER — Ambulatory Visit: Payer: Self-pay

## 2024-12-26 ENCOUNTER — Ambulatory Visit

## 2024-12-26 ENCOUNTER — Ambulatory Visit: Payer: Self-pay

## 2025-01-02 ENCOUNTER — Ambulatory Visit: Payer: Self-pay

## 2025-01-02 ENCOUNTER — Ambulatory Visit

## 2025-01-06 ENCOUNTER — Ambulatory Visit: Payer: Self-pay

## 2025-01-06 ENCOUNTER — Ambulatory Visit

## 2025-01-09 ENCOUNTER — Ambulatory Visit: Payer: Self-pay

## 2025-01-09 ENCOUNTER — Ambulatory Visit

## 2025-01-13 ENCOUNTER — Ambulatory Visit

## 2025-01-13 ENCOUNTER — Ambulatory Visit: Payer: Self-pay

## 2025-01-15 ENCOUNTER — Ambulatory Visit

## 2025-01-20 ENCOUNTER — Ambulatory Visit

## 2025-01-22 ENCOUNTER — Ambulatory Visit

## 2025-01-27 ENCOUNTER — Ambulatory Visit

## 2025-01-29 ENCOUNTER — Ambulatory Visit
